# Patient Record
Sex: Female | Born: 1995 | Race: White | Hispanic: No | Marital: Single | Smoking: Never smoker
Health system: Southern US, Community
[De-identification: ages and names within clinical notes are randomized; demographics above are authoritative.]

## PROBLEM LIST (undated history)

## (undated) HISTORY — PX: SPINAL FUSION: SHX223

---

## 2018-03-16 ENCOUNTER — Emergency Department (HOSPITAL_COMMUNITY)
Admission: EM | Admit: 2018-03-16 | Discharge: 2018-03-16 | Disposition: A | Discharge status: Home / Self Care | Payer: 59 | Attending: Emergency Medicine | Admitting: Emergency Medicine

## 2018-03-16 ENCOUNTER — Encounter (HOSPITAL_COMMUNITY): Payer: Self-pay | Admitting: Emergency Medicine

## 2018-03-16 ENCOUNTER — Emergency Department (HOSPITAL_COMMUNITY): Payer: 59

## 2018-03-16 DIAGNOSIS — W208XXA Other cause of strike by thrown, projected or falling object, initial encounter: Secondary | ICD-10-CM | POA: Diagnosis not present

## 2018-03-16 DIAGNOSIS — Y939 Activity, unspecified: Secondary | ICD-10-CM | POA: Diagnosis not present

## 2018-03-16 DIAGNOSIS — M25512 Pain in left shoulder: Secondary | ICD-10-CM | POA: Diagnosis not present

## 2018-03-16 DIAGNOSIS — Y929 Unspecified place or not applicable: Secondary | ICD-10-CM | POA: Insufficient documentation

## 2018-03-16 DIAGNOSIS — Y999 Unspecified external cause status: Secondary | ICD-10-CM | POA: Insufficient documentation

## 2018-03-16 MED ORDER — OXYCODONE-ACETAMINOPHEN 5-325 MG PO TABS
1.0000 | ORAL_TABLET | Freq: Once | ORAL | Status: AC
  Administered 2018-03-16: 1 via ORAL
  Filled 2018-03-16: qty 1

## 2018-03-16 MED ORDER — IBUPROFEN 800 MG PO TABS: 800.0000 mg | ORAL_TABLET | Freq: Three times a day (TID) | ORAL | 0 refills | Status: AC

## 2018-03-16 NOTE — ED Triage Notes (Signed)
Pt reports fall prior to arrival and has had her shoulder hyperextended above her head since. Reports she hasn't tried to lower it, no deformity present.

## 2018-03-16 NOTE — ED Provider Notes (Signed)
[facility] EMERGENCY DEPARTMENT Provider Note   CSN: [id number] Arrival date & time: 03/16/18  0035     History   Chief Complaint Chief Complaint  Patient presents with  . Shoulder Pain    HPI Adriana Mason is a 22 y.o. female.  The history is provided by the patient and medical records.  Shoulder Pain       22 year old female here with left shoulder pain.  States she was holding a camera on a tripod with her left arm stretched out across her body and the camera started to fall over dragging her left arm down.  States since then she has felt a lot of pain in her left shoulder, worse when hanging down by her side but better when supported.  She denies any numbness or weakness of her left arm.  She is right-hand dominant.  Sling was applied in triage with some relief with positioning.  History reviewed. No pertinent past medical history.  There are no active problems to display for this patient.   Past Surgical History:  Procedure Laterality Date  . SPINAL FUSION       OB History   None      Home Medications    Prior to Admission medications   Not on File    Family History No family history on file.  Social History Social History   Tobacco Use  . Smoking status: Never Smoker  . Smokeless tobacco: Never Used  Substance Use Topics  . Alcohol use: Yes    Comment: socially  . Drug use: Not Currently     Allergies   Patient has no known allergies.   Review of Systems Review of Systems  Musculoskeletal: Positive for arthralgias.  All other systems reviewed and are negative.    Physical Exam Updated Vital Signs BP 112/85 (BP Location: Right Arm)   Pulse 71   Temp 98.3 F (36.8 C) (Oral)   Resp 18   SpO2 100%   Physical Exam  Constitutional: She is oriented to person, place, and time. She appears well-developed and well-nourished.  HENT:  Head: Normocephalic and atraumatic.  Mouth/Throat: Oropharynx is clear and  moist.  Eyes: Pupils are equal, round, and reactive to light. Conjunctivae and EOM are normal.  Neck: Normal range of motion.  Cardiovascular: Normal rate, regular rhythm and normal heart sounds.  Pulmonary/Chest: Effort normal and breath sounds normal.  Abdominal: Soft. Bowel sounds are normal.  Musculoskeletal: Normal range of motion.  Left shoulder overall normal appearance without visible swelling or deformity, nearly full range of motion but some limitations due to pain, there is no appreciable bruising or swelling, full range of motion of the elbow, wrist, and all fingers, normal grip strength, normal distal sensation and perfusion  Neurological: She is alert and oriented to person, place, and time.  Skin: Skin is warm and dry.  Psychiatric: She has a normal mood and affect.  Nursing note and vitals reviewed.    ED Treatments / Results  Labs (all labs ordered are listed, but only abnormal results are displayed) Labs Reviewed - No data to display  EKG None  Radiology Dg Shoulder Left  Result Date: 03/16/2018 CLINICAL DATA:  Camera fell on arm.  History of dislocation. EXAM: LEFT SHOULDER - 2+ VIEW COMPARISON:  None. FINDINGS: The humeral head is well-formed and located. The subacromial, glenohumeral and acromioclavicular joint spaces are intact. No destructive bony lesions. Soft tissue planes are non-suspicious. Harrington rods. IMPRESSION: Negative. Electronically Signed  By: Awilda Metro M.D.   On: 03/16/2018 01:21    Procedures Procedures (including critical care time)  Medications Ordered in ED Medications  oxyCODONE-acetaminophen (PERCOCET/ROXICET) 5-325 MG per tablet 1 tablet (1 tablet Oral Given 03/16/18 0538)     Initial Impression / Assessment and Plan / ED Course  I have reviewed the triage vital signs and the nursing notes.  Pertinent labs & imaging results that were available during my care of the patient were reviewed by me and considered in my  medical decision making (see chart for details).  22 year old female presenting to the ED with left shoulder pain.  Holding [place] tripod with left arm extended across her body when it started to fall.  From mechanisms it sounds like her shoulder was hyperextended.  She has no acute bony deformity, swelling, or other visible signs of trauma on exam.  Somewhat limited range of motion but no strength or sensory deficit.  Her arm is neurovascularly intact.  Screening x-ray is negative.  Patient has had improvement of symptoms with immobilization and support in sling, continue this at home.  We will also start anti-inflammatories.  Discussed using heat/ice to help as well.  She was given orthopedic follow-up if no improvement the next 2 days or symptoms worsening.  She will return here for any new or worsening symptoms.  Final Clinical Impressions(s) / ED Diagnoses   Final diagnoses:  Acute pain of left shoulder    ED Discharge Orders         Ordered    ibuprofen (ADVIL,MOTRIN) 800 MG tablet  3 times daily     03/16/18 0559           Garlon Hatchet, PA-C 03/16/18 0602    Nira Conn, MD 03/17/18 [phone]

## 2018-03-16 NOTE — Discharge Instructions (Signed)
Take the prescribed medication as directed.  Can use heat or ice to help with pain as well. Follow-up with orthopedics if no improvement in the next few days or if symptoms worsening.  Can call for appt if needed. Return to the ED for new or worsening symptoms.

## 2019-05-03 IMAGING — DX DG SHOULDER 2+V*L*
2 series · 2 of 2 positions shown · non-contrast
Comparison: None.

CLINICAL DATA: Camera fell on arm.  History of dislocation.

EXAM:
LEFT SHOULDER - 2+ VIEW

[shoulder grashey]
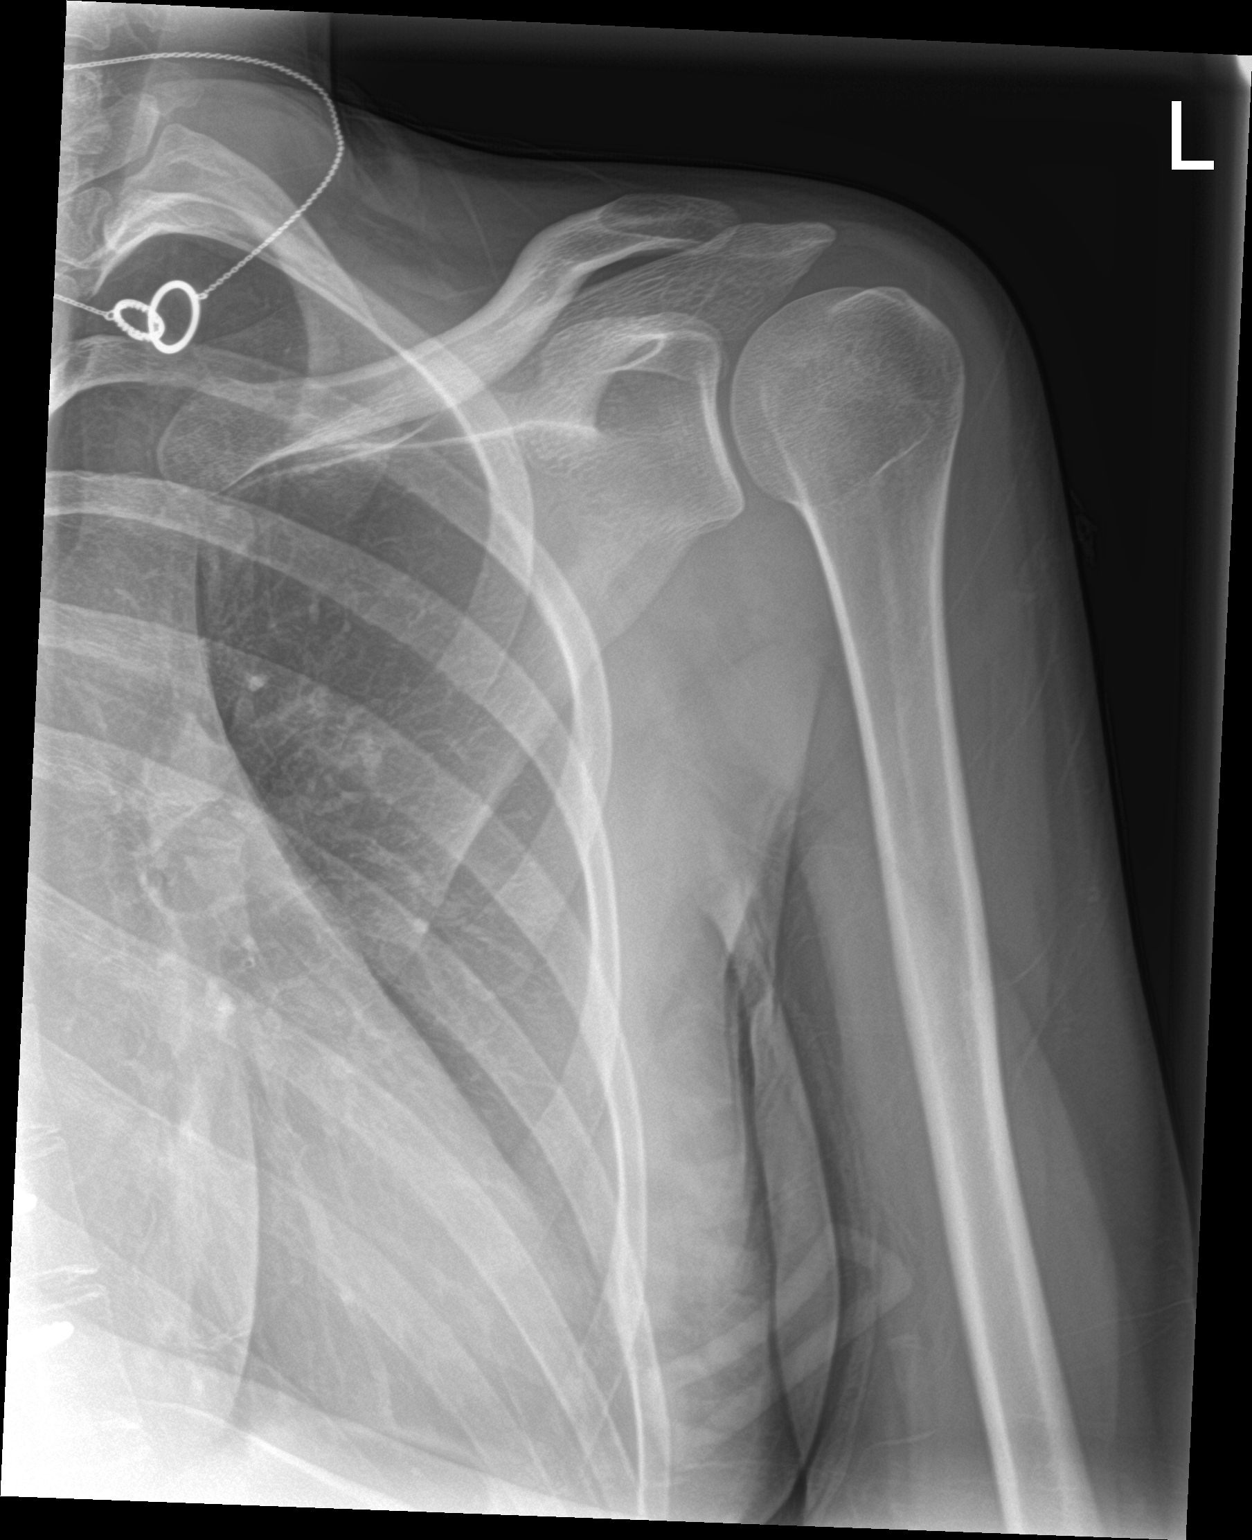

[shoulder y view]
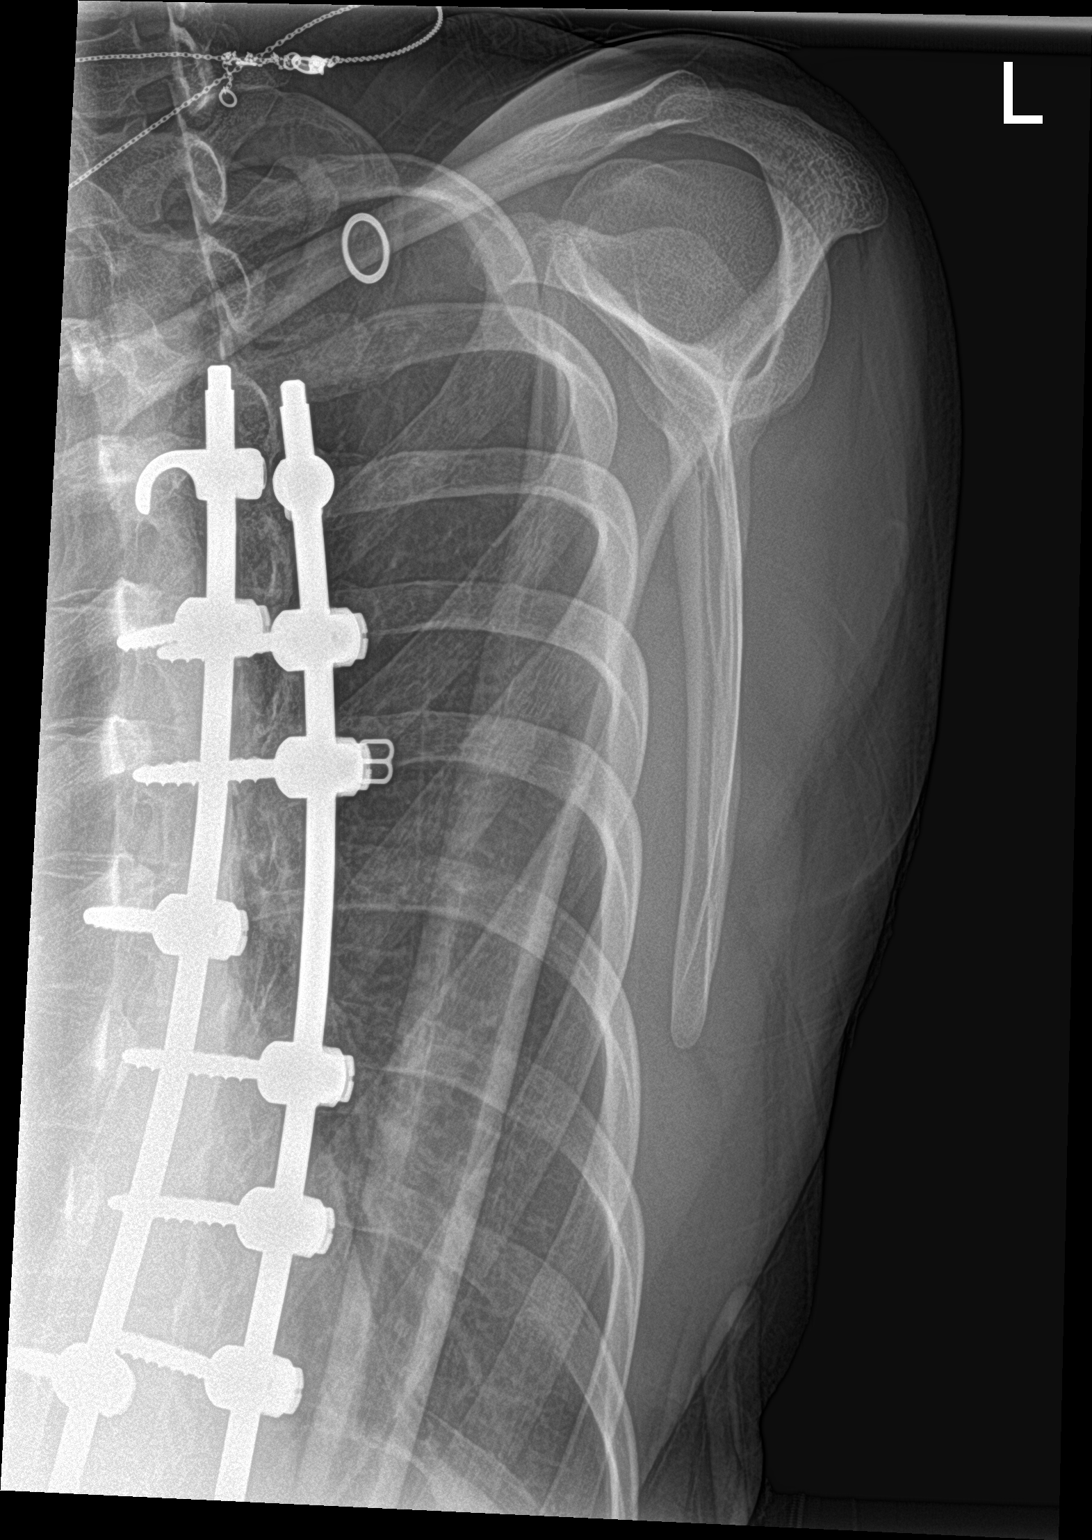

[2 of 2 positions shown; findings below may reference images not displayed]

FINDINGS: The humeral head is well-formed and located. The subacromial,
glenohumeral and acromioclavicular joint spaces are intact. No
destructive bony lesions. Soft tissue planes are non-suspicious.
Harrington rods.
IMPRESSION: Negative.
